# Patient Record
Sex: Female | Born: 1975 | Race: White | Hispanic: No | Marital: Married | Smoking: Former smoker
Health system: Southern US, Community
[De-identification: ages and names within clinical notes are randomized; demographics above are authoritative.]

## PROBLEM LIST (undated history)

## (undated) DIAGNOSIS — E785 Hyperlipidemia, unspecified: Secondary | ICD-10-CM

## (undated) HISTORY — DX: Hyperlipidemia, unspecified: E78.5

## (undated) HISTORY — PX: APPENDECTOMY: SHX54

## (undated) HISTORY — PX: COLONOSCOPY: SHX174

---

## 2015-12-02 ENCOUNTER — Other Ambulatory Visit: Payer: Self-pay | Admitting: Family Medicine

## 2015-12-02 DIAGNOSIS — N6310 Unspecified lump in the right breast, unspecified quadrant: Secondary | ICD-10-CM

## 2015-12-12 ENCOUNTER — Ambulatory Visit
Admission: RE | Admit: 2015-12-12 | Discharge: 2015-12-12 | Disposition: A | Discharge status: Home / Self Care | Payer: Managed Care, Other (non HMO) | Source: Ambulatory Visit | Attending: Family Medicine | Admitting: Family Medicine

## 2015-12-12 DIAGNOSIS — N6310 Unspecified lump in the right breast, unspecified quadrant: Secondary | ICD-10-CM

## 2016-11-09 ENCOUNTER — Other Ambulatory Visit: Payer: Self-pay | Admitting: Family Medicine

## 2016-11-09 DIAGNOSIS — Z1231 Encounter for screening mammogram for malignant neoplasm of breast: Secondary | ICD-10-CM

## 2016-12-13 ENCOUNTER — Ambulatory Visit
Admission: RE | Admit: 2016-12-13 | Discharge: 2016-12-13 | Disposition: A | Discharge status: Home / Self Care | Payer: Managed Care, Other (non HMO) | Source: Ambulatory Visit | Attending: Family Medicine | Admitting: Family Medicine

## 2016-12-13 DIAGNOSIS — Z1231 Encounter for screening mammogram for malignant neoplasm of breast: Secondary | ICD-10-CM

## 2017-11-10 ENCOUNTER — Other Ambulatory Visit: Payer: Self-pay | Admitting: Family Medicine

## 2017-11-10 DIAGNOSIS — Z1231 Encounter for screening mammogram for malignant neoplasm of breast: Secondary | ICD-10-CM

## 2017-12-14 ENCOUNTER — Ambulatory Visit
Admission: RE | Admit: 2017-12-14 | Discharge: 2017-12-14 | Disposition: A | Discharge status: Home / Self Care | Payer: Commercial Managed Care - PPO | Source: Ambulatory Visit | Attending: Family Medicine | Admitting: Family Medicine

## 2017-12-14 ENCOUNTER — Encounter: Payer: Self-pay | Admitting: Radiology

## 2017-12-14 DIAGNOSIS — Z1231 Encounter for screening mammogram for malignant neoplasm of breast: Secondary | ICD-10-CM

## 2018-11-27 ENCOUNTER — Other Ambulatory Visit: Payer: Self-pay | Admitting: Family Medicine

## 2018-11-27 DIAGNOSIS — Z1231 Encounter for screening mammogram for malignant neoplasm of breast: Secondary | ICD-10-CM

## 2019-01-08 ENCOUNTER — Other Ambulatory Visit: Payer: Self-pay

## 2019-01-08 ENCOUNTER — Ambulatory Visit
Admission: RE | Admit: 2019-01-08 | Discharge: 2019-01-08 | Disposition: A | Discharge status: Home / Self Care | Payer: Commercial Managed Care - PPO | Source: Ambulatory Visit | Attending: Family Medicine | Admitting: Family Medicine

## 2019-01-08 DIAGNOSIS — Z1231 Encounter for screening mammogram for malignant neoplasm of breast: Secondary | ICD-10-CM

## 2019-12-07 ENCOUNTER — Other Ambulatory Visit: Payer: Self-pay | Admitting: Family Medicine

## 2019-12-07 DIAGNOSIS — Z1231 Encounter for screening mammogram for malignant neoplasm of breast: Secondary | ICD-10-CM

## 2020-01-09 ENCOUNTER — Other Ambulatory Visit: Payer: Self-pay

## 2020-01-09 ENCOUNTER — Other Ambulatory Visit: Payer: Self-pay | Admitting: Family Medicine

## 2020-01-09 ENCOUNTER — Ambulatory Visit
Admission: RE | Admit: 2020-01-09 | Discharge: 2020-01-09 | Disposition: A | Discharge status: Home / Self Care | Payer: Commercial Managed Care - PPO | Source: Ambulatory Visit | Attending: Family Medicine | Admitting: Family Medicine

## 2020-01-09 DIAGNOSIS — Z1231 Encounter for screening mammogram for malignant neoplasm of breast: Secondary | ICD-10-CM

## 2020-12-12 ENCOUNTER — Other Ambulatory Visit: Payer: Self-pay | Admitting: Family Medicine

## 2020-12-12 DIAGNOSIS — Z1231 Encounter for screening mammogram for malignant neoplasm of breast: Secondary | ICD-10-CM

## 2021-01-09 ENCOUNTER — Other Ambulatory Visit: Payer: Self-pay

## 2021-01-09 ENCOUNTER — Ambulatory Visit
Admission: RE | Admit: 2021-01-09 | Discharge: 2021-01-09 | Disposition: A | Discharge status: Home / Self Care | Payer: Commercial Managed Care - PPO | Source: Ambulatory Visit

## 2021-01-09 DIAGNOSIS — Z1231 Encounter for screening mammogram for malignant neoplasm of breast: Secondary | ICD-10-CM

## 2021-12-09 ENCOUNTER — Other Ambulatory Visit: Payer: Self-pay | Admitting: Family Medicine

## 2021-12-09 DIAGNOSIS — Z1231 Encounter for screening mammogram for malignant neoplasm of breast: Secondary | ICD-10-CM

## 2022-01-18 ENCOUNTER — Ambulatory Visit: Payer: Commercial Managed Care - PPO

## 2022-02-15 ENCOUNTER — Ambulatory Visit
Admission: RE | Admit: 2022-02-15 | Discharge: 2022-02-15 | Disposition: A | Discharge status: Home / Self Care | Payer: Commercial Managed Care - PPO | Source: Ambulatory Visit | Attending: Family Medicine | Admitting: Family Medicine

## 2022-02-15 DIAGNOSIS — Z1231 Encounter for screening mammogram for malignant neoplasm of breast: Secondary | ICD-10-CM

## 2022-02-25 ENCOUNTER — Other Ambulatory Visit (HOSPITAL_BASED_OUTPATIENT_CLINIC_OR_DEPARTMENT_OTHER): Payer: Self-pay

## 2022-02-25 DIAGNOSIS — R0602 Shortness of breath: Secondary | ICD-10-CM

## 2022-02-26 ENCOUNTER — Ambulatory Visit (INDEPENDENT_AMBULATORY_CARE_PROVIDER_SITE_OTHER): Payer: Commercial Managed Care - PPO | Admitting: Pulmonary Disease

## 2022-02-26 ENCOUNTER — Encounter (HOSPITAL_BASED_OUTPATIENT_CLINIC_OR_DEPARTMENT_OTHER): Payer: Self-pay | Admitting: Pulmonary Disease

## 2022-02-26 VITALS — BP 110/80 | HR 88 | Ht 61.0 in | Wt 162.0 lb

## 2022-02-26 DIAGNOSIS — R0602 Shortness of breath: Secondary | ICD-10-CM

## 2022-02-26 DIAGNOSIS — J4540 Moderate persistent asthma, uncomplicated: Secondary | ICD-10-CM | POA: Diagnosis not present

## 2022-02-26 LAB — PULMONARY FUNCTION TEST
DL/VA % pred: 137 %
DL/VA: 6.09 ml/min/mmHg/L
DLCO cor % pred: 122 %
DLCO cor: 23.55 ml/min/mmHg
DLCO unc % pred: 122 %
DLCO unc: 23.55 ml/min/mmHg
FEF 25-75 Post: 2.81 L/sec
FEF 25-75 Pre: 3.01 L/sec
FEF2575-%Change-Post: -6 %
FEF2575-%Pred-Post: 103 %
FEF2575-%Pred-Pre: 111 %
FEV1-%Change-Post: 0 %
FEV1-%Pred-Post: 96 %
FEV1-%Pred-Pre: 95 %
FEV1-Post: 2.49 L
FEV1-Pre: 2.49 L
FEV1FVC-%Change-Post: 0 %
FEV1FVC-%Pred-Pre: 105 %
FEV6-%Change-Post: 0 %
FEV6-%Pred-Post: 91 %
FEV6-%Pred-Pre: 92 %
FEV6-Post: 2.9 L
FEV6-Pre: 2.92 L
FEV6FVC-%Pred-Post: 102 %
FEV6FVC-%Pred-Pre: 102 %
FVC-%Change-Post: 0 %
FVC-%Pred-Post: 89 %
FVC-%Pred-Pre: 90 %
FVC-Post: 2.9 L
FVC-Pre: 2.92 L
Post FEV1/FVC ratio: 86 %
Post FEV6/FVC ratio: 100 %
Pre FEV1/FVC ratio: 85 %
Pre FEV6/FVC Ratio: 100 %
RV % pred: 99 %
RV: 1.57 L
TLC % pred: 93 %
TLC: 4.3 L

## 2022-02-26 MED ORDER — FLUTICASONE-SALMETEROL 250-50 MCG/ACT IN AEPB: 1.0000 | INHALATION_SPRAY | Freq: Two times a day (BID) | RESPIRATORY_TRACT | 5 refills | Status: DC

## 2022-02-26 MED ORDER — ALBUTEROL SULFATE 0.63 MG/3ML IN NEBU: 1.0000 | INHALATION_SOLUTION | Freq: Four times a day (QID) | RESPIRATORY_TRACT | 0 refills | Status: AC | PRN

## 2022-02-26 NOTE — Progress Notes (Signed)
Full PFT Performed Today  

## 2022-02-26 NOTE — Patient Instructions (Signed)
Full PFT Performed Today  

## 2022-02-26 NOTE — Progress Notes (Signed)
Subjective:   PATIENT ID: Emily Sandoval GENDER: female DOB: 05-Nov-1975, MRN: [id number]  Chief Complaint  Patient presents with   Consult    Breathing trouble, possible asthma     Reason for Visit: New consult for possible asthma  Ms. Masa Lubin is a 46 year old female former smoker with asthma and HLD who presents as new consult for cough and shortness of breath x 4 months.  During the summer she reports a cold that left her with a persistent hacky cough. Also associated with her loss of taste and smell but did not test for COVID. She used her daughter's albuterol inhaler that helped stop the cough. She was evaluated by her PCP, Dr. Chanetta Marshall and treated with prednisone x 5 days in August 2023 with complete resolution of cough. However it returned after stopping. She has been using her albuterol inhaler once a day. She was started on Symbicort and has had partial improvement. She still has shortness of breath and a "weight on her chest."  Recently went to the [place] over Thanksgiving, three weeks ago, and has noticed cough with increased humidity.  Reports history of allergy related asthma attacks and recurrent bronchitis in her 20s but not on maintenance meds. No issues with chronic respiratory issues since then.  Social History: Works in Data processing manager  I have personally reviewed patient's past medical/family/social history, allergies, current medications.  Past Medical History:  Diagnosis Date   Hyperlipidemia      Family History  Problem Relation Age of Onset   Osteoporosis Mother    High Cholesterol Mother    High blood pressure Father    Stroke Father    High Cholesterol Father    Healthy Sister    Bipolar disorder Brother    Breast cancer Maternal Grandmother 41   Dementia Paternal Grandmother    Stroke Paternal Grandfather      Social History   Occupational History   Not on file  Tobacco Use   Smoking status: Former    Packs/day: 0.25     Years: 10.00    Total pack years: 2.50    Types: Cigarettes    Quit date: 2003    Years since quitting: 20.9   Smokeless tobacco: Never  Vaping Use   Vaping Use: Never used  Substance and Sexual Activity   Alcohol use: Not on file   Drug use: Not on file   Sexual activity: Not on file    Allergies  Allergen Reactions   Morphine Nausea Only    Other Reaction(s): vomiting   Hydrocodone Nausea And Vomiting   Sulfamethoxazole-Trimethoprim Other (See Comments) and Nausea And Vomiting   Tetanus Antitoxin Other (See Comments)   Tetanus Immune Globulin Nausea And Vomiting     Outpatient Medications Prior to Visit  Medication Sig Dispense Refill   albuterol (VENTOLIN HFA) 108 (90 Base) MCG/ACT inhaler SMARTSIG:1 Puff(s) Via Inhaler Every 4 Hours PRN     b complex vitamins capsule Take 1 capsule by mouth daily.     cetirizine (ZYRTEC) 10 MG tablet Take 1 tablet by mouth daily.     levonorgestrel (MIRENA, 52 MG,) 20 MCG/DAY IUD insert one intrauterine device via intrauterine route, provided by [facility]     rosuvastatin (CRESTOR) 10 MG tablet Take 10 mg by mouth at bedtime.     topiramate (TOPAMAX) 25 MG tablet Take 50 mg by mouth daily.     SYMBICORT 80-4.5 MCG/ACT inhaler Inhale into the lungs.  No facility-administered medications prior to visit.    Review of Systems  Constitutional:  Negative for chills, diaphoresis, fever, malaise/fatigue and weight loss.  HENT:  Positive for congestion.   Respiratory:  Positive for cough and shortness of breath. Negative for hemoptysis, sputum production and wheezing.   Cardiovascular:  Negative for chest pain, palpitations and leg swelling.  Gastrointestinal:  Positive for heartburn.     Objective:   Vitals:   02/26/22 1539  BP: 110/80  Pulse: 88  SpO2: 100%  Weight: 162 lb (73.5 kg)  Height: 5\' 1"  (1.549 m)   SpO2: 100 % O2 Device: None (Room air)  Physical Exam: General: Well-appearing, no acute distress HENT: [place],  AT Eyes: EOMI, no scleral icterus Respiratory: Clear to auscultation bilaterally.  No crackles, wheezing or rales Cardiovascular: RRR, -M/R/G, no JVD Extremities:-Edema,-tenderness Neuro: AAO x4, CNII-XII grossly intact Psych: Normal mood, normal affect  Data Reviewed:  Imaging: None on file  PFT: 02/26/22 FVC 2.90 (89%) FEV1 2.49 (96%) Ratio 85  TLC 93% DLCO 122%. No significant BD response Interpretation: Normal PFTs  Sandoval: CBC No results found for: "WBC", "RBC", "HGB", "HCT", "PLT", "MCV", "MCH", "MCHC", "RDW", "LYMPHSABS", "MONOABS", "EOSABS", "BASOSABS"      Assessment & Plan:   Discussion: 46 year old female former smoker with asthma and HLD who presents as new consult for cough and shortness of breath x 4 months.  Reviewed PFTs which is overall normal. Increased DLCO is suggestive of asthma  Discussed clinical course and management of asthma including bronchodilator regimen and action plan for exacerbation. Decline methacholine challenge. Will increase ICS for symptom relief however if ineffective reduce to Symbicort medium or low-dose  We reviewed clinical course of COVID-19 including long-term complications including post-inflammatory lung disease and long hauler symptoms.   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 Rinse out mouth after use --CONTINUE Albuterol AS NEEDED for shortness of breath or wheezing  Health Maintenance Immunization History  Administered Date(s) Administered   Influenza-Unspecified 01/27/2022    No orders of the defined types were placed in this encounter.  Meds ordered this encounter  Medications   fluticasone-salmeterol (ADVAIR DISKUS) 250-50 MCG/ACT AEPB    Sig: Inhale 1 puff into the lungs in the morning and at bedtime.    Dispense:  60 each    Refill:  5   albuterol (ACCUNEB) 0.63 MG/3ML nebulizer solution    Sig: Take 3 mLs (0.63 mg  total) by nebulization every 6 (six) hours as needed for wheezing.    Dispense:  75 mL    Refill:  0    Return in about 2 months (around 04/29/2022).  I have spent a total time of 45-minutes on the day of the appointment reviewing prior documentation, coordinating care and discussing medical diagnosis and plan with the patient/family. Imaging, Sandoval and tests included in this note have been reviewed and interpreted independently by me.  Tasmia Blumer 05/01/2022, MD [facility] 02/26/2022 4:30 PM  Office Number [phone]

## 2022-02-26 NOTE — Patient Instructions (Addendum)
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 Rinse out mouth after use --CONTINUE Albuterol AS NEEDED for shortness of breath or wheezing --Albuterol neb meds ordered  Follow-up with me in 2 months

## 2022-05-03 ENCOUNTER — Ambulatory Visit (HOSPITAL_BASED_OUTPATIENT_CLINIC_OR_DEPARTMENT_OTHER): Payer: Commercial Managed Care - PPO | Admitting: Pulmonary Disease

## 2022-07-21 ENCOUNTER — Other Ambulatory Visit: Payer: Self-pay

## 2022-07-23 ENCOUNTER — Other Ambulatory Visit (HOSPITAL_BASED_OUTPATIENT_CLINIC_OR_DEPARTMENT_OTHER): Payer: Self-pay

## 2022-07-23 MED ORDER — TIRZEPATIDE-WEIGHT MANAGEMENT 5 MG/0.5ML ~~LOC~~ SOAJ
5.0000 mg | SUBCUTANEOUS | 0 refills | Status: DC
  Filled 2022-07-23 – 2022-07-30 (×2): qty 2, 28d supply, fill #0

## 2022-07-26 ENCOUNTER — Other Ambulatory Visit (HOSPITAL_BASED_OUTPATIENT_CLINIC_OR_DEPARTMENT_OTHER): Payer: Self-pay

## 2022-07-26 MED ORDER — ONDANSETRON HCL 4 MG PO TABS
4.0000 mg | ORAL_TABLET | Freq: Three times a day (TID) | ORAL | 0 refills | Status: DC | PRN
  Filled 2022-07-26: qty 30, 10d supply, fill #0

## 2022-07-26 MED ORDER — WEGOVY 0.25 MG/0.5ML ~~LOC~~ SOAJ
0.2500 mg | SUBCUTANEOUS | 0 refills | Status: DC
  Filled 2022-07-26: qty 2, 28d supply, fill #0

## 2022-07-30 ENCOUNTER — Other Ambulatory Visit (HOSPITAL_BASED_OUTPATIENT_CLINIC_OR_DEPARTMENT_OTHER): Payer: Self-pay

## 2022-07-31 ENCOUNTER — Other Ambulatory Visit (HOSPITAL_BASED_OUTPATIENT_CLINIC_OR_DEPARTMENT_OTHER): Payer: Self-pay

## 2022-08-17 ENCOUNTER — Encounter (HOSPITAL_BASED_OUTPATIENT_CLINIC_OR_DEPARTMENT_OTHER): Payer: Self-pay | Admitting: Pharmacist

## 2022-08-17 ENCOUNTER — Other Ambulatory Visit (HOSPITAL_BASED_OUTPATIENT_CLINIC_OR_DEPARTMENT_OTHER): Payer: Self-pay

## 2022-08-17 MED ORDER — ZEPBOUND 5 MG/0.5ML ~~LOC~~ SOAJ
5.0000 mg | SUBCUTANEOUS | 0 refills | Status: DC
  Filled 2022-08-17 – 2022-08-20 (×2): qty 2, 28d supply, fill #0

## 2022-08-20 ENCOUNTER — Other Ambulatory Visit (HOSPITAL_BASED_OUTPATIENT_CLINIC_OR_DEPARTMENT_OTHER): Payer: Self-pay

## 2022-09-14 ENCOUNTER — Other Ambulatory Visit (HOSPITAL_BASED_OUTPATIENT_CLINIC_OR_DEPARTMENT_OTHER): Payer: Self-pay

## 2022-09-14 MED ORDER — ZEPBOUND 5 MG/0.5ML ~~LOC~~ SOAJ
SUBCUTANEOUS | 0 refills | Status: DC
  Filled 2022-09-14: qty 2, 30d supply, fill #0

## 2022-09-22 ENCOUNTER — Other Ambulatory Visit (HOSPITAL_COMMUNITY): Payer: Self-pay | Admitting: Orthopedic Surgery

## 2022-10-11 ENCOUNTER — Other Ambulatory Visit (HOSPITAL_BASED_OUTPATIENT_CLINIC_OR_DEPARTMENT_OTHER): Payer: Self-pay

## 2022-10-11 MED ORDER — ZEPBOUND 5 MG/0.5ML ~~LOC~~ SOAJ
5.0000 mg | SUBCUTANEOUS | 0 refills | Status: DC
  Filled 2022-10-11: qty 2, 28d supply, fill #0

## 2022-11-09 ENCOUNTER — Other Ambulatory Visit (HOSPITAL_BASED_OUTPATIENT_CLINIC_OR_DEPARTMENT_OTHER): Payer: Self-pay

## 2022-11-09 MED ORDER — ZEPBOUND 5 MG/0.5ML ~~LOC~~ SOAJ
5.0000 mg | SUBCUTANEOUS | 0 refills | Status: AC
  Filled 2022-11-09: qty 2, 28d supply, fill #0

## 2022-12-06 ENCOUNTER — Other Ambulatory Visit (HOSPITAL_BASED_OUTPATIENT_CLINIC_OR_DEPARTMENT_OTHER): Payer: Self-pay

## 2022-12-06 MED ORDER — ZEPBOUND 7.5 MG/0.5ML ~~LOC~~ SOAJ
7.5000 mg | SUBCUTANEOUS | 0 refills | Status: AC
  Filled 2022-12-06: qty 2, 28d supply, fill #0

## 2022-12-16 IMAGING — MG MM DIGITAL SCREENING BILAT W/ TOMO AND CAD
8 series · 8 of 24 positions shown · non-contrast
Comparison: Previous exam(s).

CLINICAL DATA: Screening.

EXAM:
DIGITAL SCREENING BILATERAL MAMMOGRAM WITH TOMOSYNTHESIS AND CAD
TECHNIQUE: Bilateral screening digital craniocaudal and mediolateral oblique
mammograms were obtained. Bilateral screening digital breast
tomosynthesis was performed. The images were evaluated with
computer-aided detection.

[L CC synth-2D]
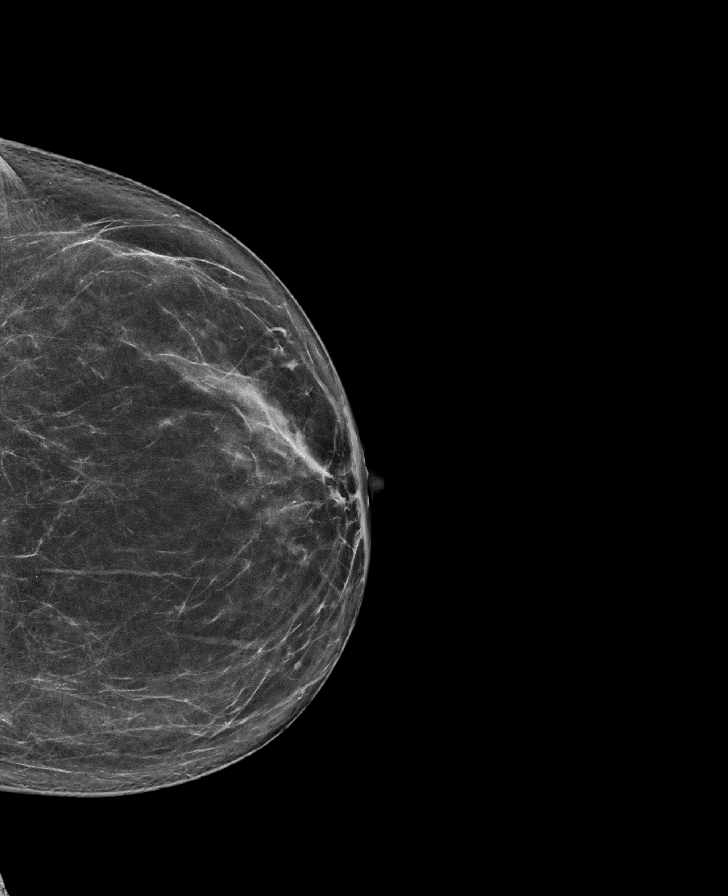

[R CC synth-2D]
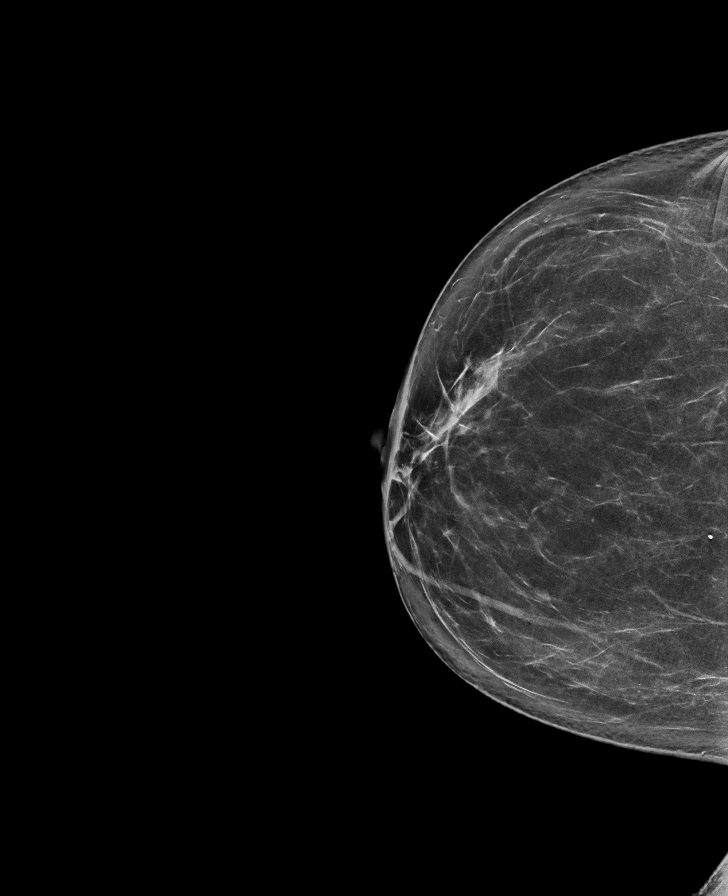

[R MLO synth-2D]
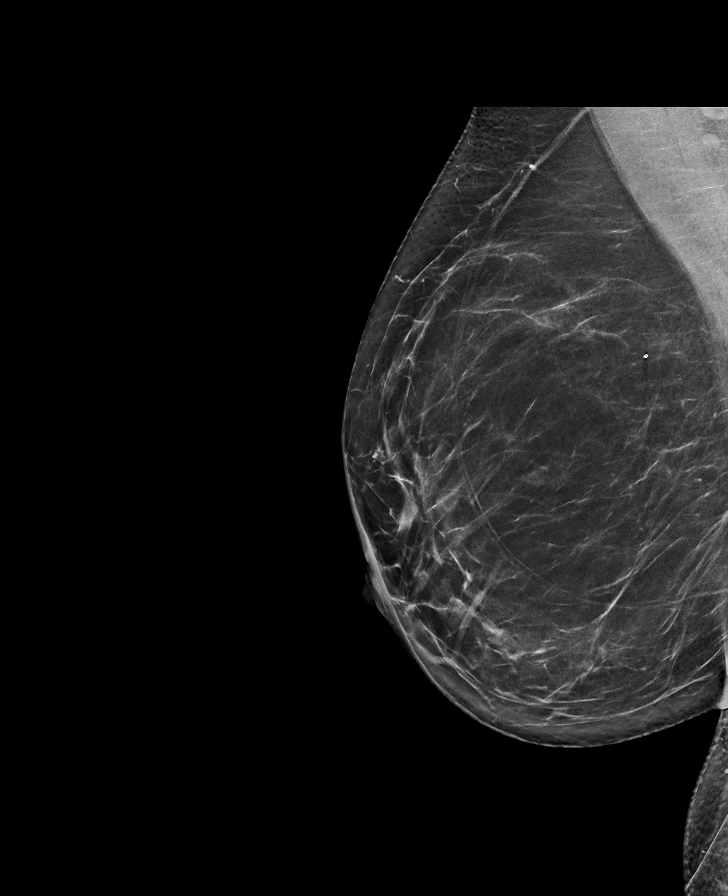

[L MLO synth-2D]
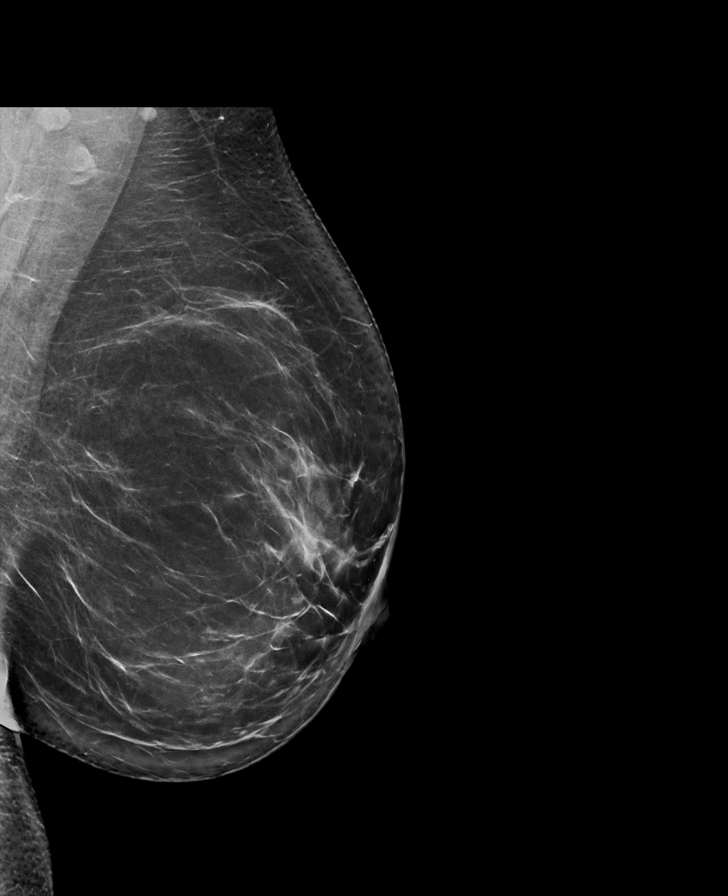

[R MLO tomo · tomo slice 40/79.0]
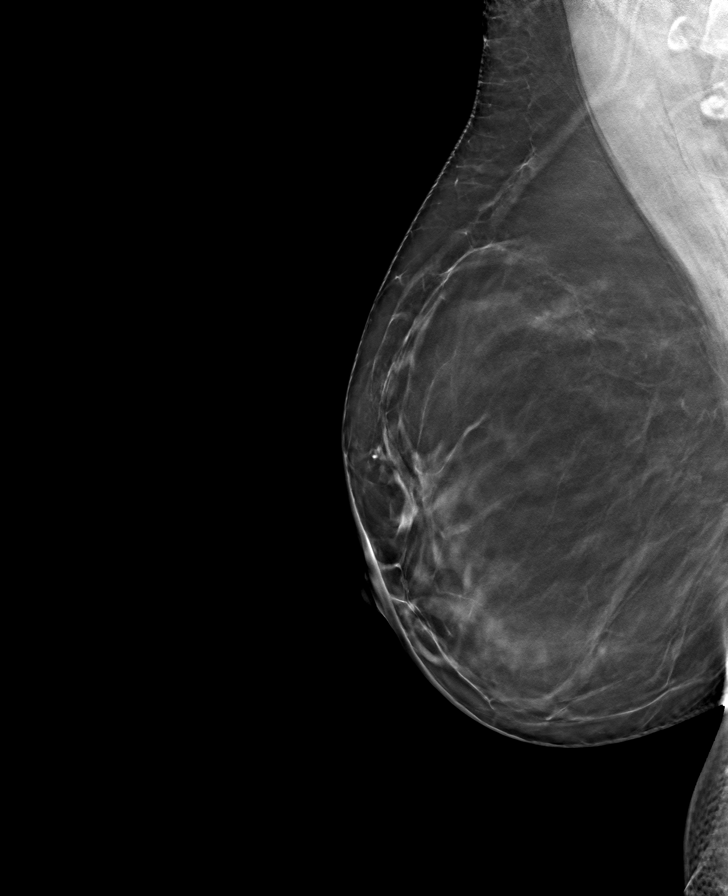

[R CC tomo · tomo slice 41/80.0]
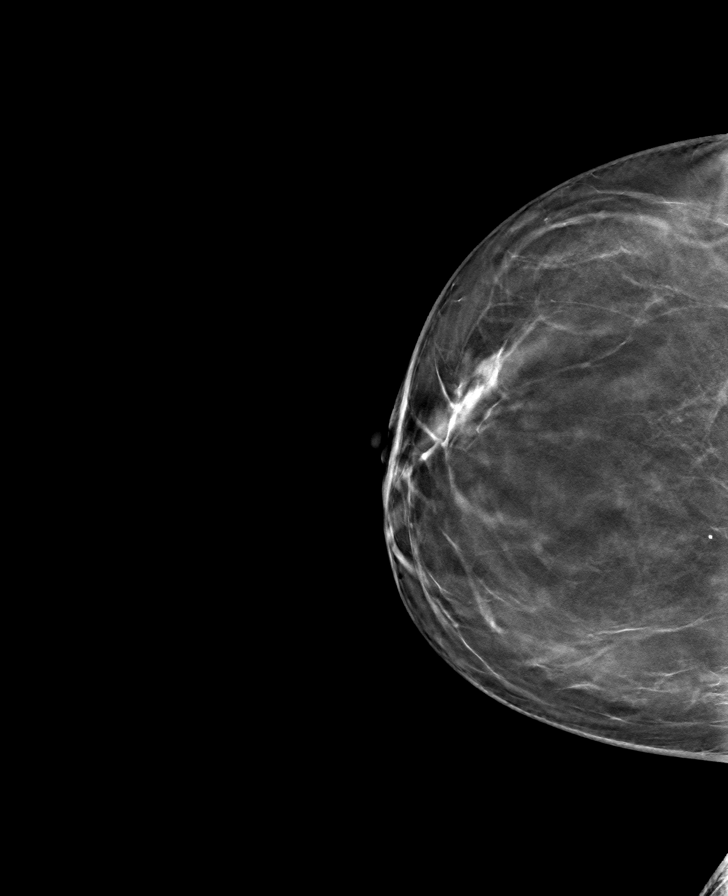

[L MLO tomo · tomo slice 45/88.0]
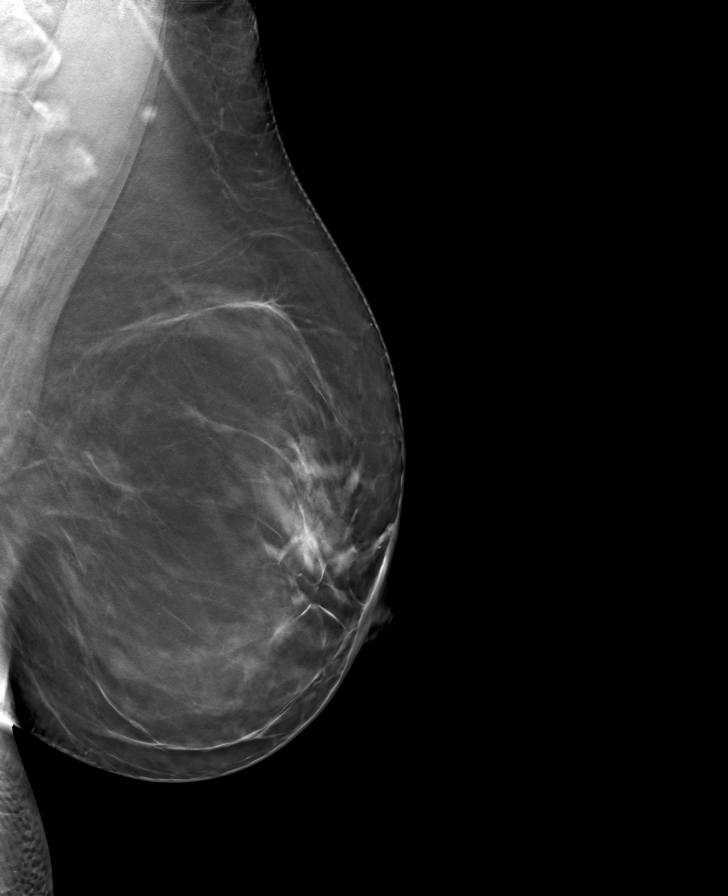

[L CC tomo · tomo slice 39/78.0]
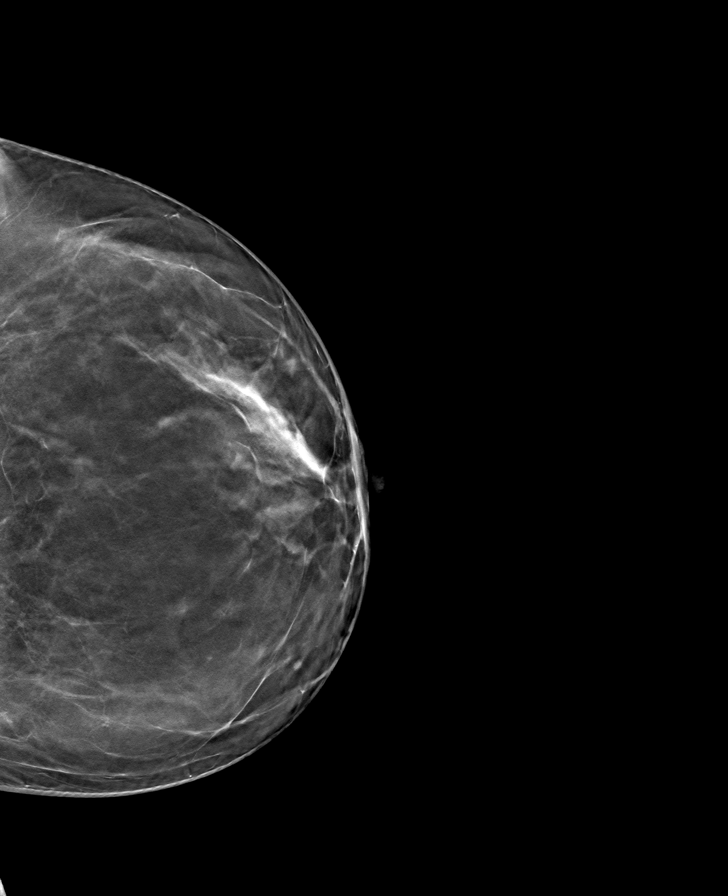

[8 of 24 positions shown; findings below may reference images not displayed]

ACR Breast Density Category b: There are scattered areas of
fibroglandular density.
FINDINGS: There are no findings suspicious for malignancy.
IMPRESSION: No mammographic evidence of malignancy. A result letter of this
screening mammogram will be mailed directly to the patient.

RECOMMENDATION:
Screening mammogram in one year. (Code:51-O-LD2)

BI-RADS CATEGORY  1: Negative.

## 2023-01-03 ENCOUNTER — Other Ambulatory Visit: Payer: Self-pay | Admitting: Family Medicine

## 2023-01-03 DIAGNOSIS — Z1231 Encounter for screening mammogram for malignant neoplasm of breast: Secondary | ICD-10-CM

## 2023-01-10 ENCOUNTER — Other Ambulatory Visit: Payer: Self-pay

## 2023-01-10 ENCOUNTER — Other Ambulatory Visit (HOSPITAL_BASED_OUTPATIENT_CLINIC_OR_DEPARTMENT_OTHER): Payer: Self-pay

## 2023-01-10 MED ORDER — ZEPBOUND 7.5 MG/0.5ML ~~LOC~~ SOAJ
7.5000 mg | SUBCUTANEOUS | 0 refills | Status: DC
  Filled 2023-01-10: qty 2, 28d supply, fill #0

## 2023-01-13 ENCOUNTER — Other Ambulatory Visit: Payer: Self-pay

## 2023-01-13 ENCOUNTER — Encounter (HOSPITAL_BASED_OUTPATIENT_CLINIC_OR_DEPARTMENT_OTHER): Payer: Self-pay | Admitting: Orthopedic Surgery

## 2023-01-14 NOTE — Progress Notes (Signed)
Ensure presurgery drink given to pt with written instruction to complete by 0515 on DOS. Pt verbalized understanding        Enhanced Recovery after Surgery for Orthopedics Enhanced Recovery after Surgery is a protocol used to improve the stress on your body and your recovery after surgery.  Patient Instructions  The night before surgery:  No food after midnight. ONLY clear liquids after midnight  The day of surgery (if you do NOT have diabetes):  Drink ONE (1) Pre-Surgery Clear Ensure as directed.   This drink was given to you during your hospital  pre-op appointment visit. The pre-op nurse will instruct you on the time to drink the  Pre-Surgery Ensure depending on your surgery time. Finish the drink at the designated time by the pre-op nurse.  Nothing else to drink after completing the  Pre-Surgery Clear Ensure.  The day of surgery (if you have diabetes): Drink ONE (1) Gatorade 2 (G2) as directed. This drink was given to you during your hospital  pre-op appointment visit.  The pre-op nurse will instruct you on the time to drink the   Gatorade 2 (G2) depending on your surgery time. Color of the Gatorade may vary. Red is not allowed. Nothing else to drink after completing the  Gatorade 2 (G2).         If you have questions, please contact your surgeon's office.

## 2023-01-19 NOTE — Anesthesia Preprocedure Evaluation (Signed)
Anesthesia Evaluation  Patient identified by MRN, date of birth, ID band Patient awake    Reviewed: Allergy & Precautions, H&P , NPO status , Patient's Chart, lab work & pertinent test results  Airway Mallampati: II  TM Distance: >3 FB Neck ROM: Full    Dental no notable dental hx.    Pulmonary neg pulmonary ROS, former smoker   Pulmonary exam normal breath sounds clear to auscultation       Cardiovascular Exercise Tolerance: Good negative cardio ROS Normal cardiovascular exam Rhythm:Regular Rate:Normal     Neuro/Psych negative neurological ROS  negative psych ROS   GI/Hepatic negative GI ROS, Neg liver ROS,,,  Endo/Other  negative endocrine ROS    Renal/GU negative Renal ROS  negative genitourinary   Musculoskeletal negative musculoskeletal ROS (+)    Abdominal   Peds negative pediatric ROS (+)  Hematology negative hematology ROS (+)   Anesthesia Other Findings   Reproductive/Obstetrics negative OB ROS                             Anesthesia Physical Anesthesia Plan  ASA: 2  Anesthesia Plan: General and Regional   Post-op Pain Management: Regional block*, Tylenol PO (pre-op)* and Celebrex PO (pre-op)*   Induction: Intravenous  PONV Risk Score and Plan: 3 and Ondansetron, Dexamethasone and Midazolam  Airway Management Planned: Oral ETT and LMA  Additional Equipment: None  Intra-op Plan:   Post-operative Plan: Extubation in OR  Informed Consent: I have reviewed the patients History and Physical, chart, labs and discussed the procedure including the risks, benefits and alternatives for the proposed anesthesia with the patient or authorized representative who has indicated his/her understanding and acceptance.     Dental advisory given  Plan Discussed with: Anesthesiologist and CRNA  Anesthesia Plan Comments: (Discussed both nerve block for pain relief post-op and GA;  including NV, sore throat, dental injury, and pulmonary complications)        Anesthesia Quick Evaluation

## 2023-01-20 ENCOUNTER — Other Ambulatory Visit: Payer: Self-pay

## 2023-01-20 ENCOUNTER — Ambulatory Visit (HOSPITAL_BASED_OUTPATIENT_CLINIC_OR_DEPARTMENT_OTHER)
Admission: RE | Admit: 2023-01-20 | Discharge: 2023-01-20 | Disposition: A | Discharge status: Home / Self Care | Payer: Commercial Managed Care - PPO | Attending: Orthopedic Surgery | Admitting: Orthopedic Surgery

## 2023-01-20 ENCOUNTER — Ambulatory Visit (HOSPITAL_BASED_OUTPATIENT_CLINIC_OR_DEPARTMENT_OTHER): Payer: Commercial Managed Care - PPO | Admitting: Anesthesiology

## 2023-01-20 ENCOUNTER — Ambulatory Visit (HOSPITAL_BASED_OUTPATIENT_CLINIC_OR_DEPARTMENT_OTHER): Payer: Commercial Managed Care - PPO

## 2023-01-20 ENCOUNTER — Encounter (HOSPITAL_BASED_OUTPATIENT_CLINIC_OR_DEPARTMENT_OTHER): Payer: Self-pay | Admitting: Orthopedic Surgery

## 2023-01-20 ENCOUNTER — Other Ambulatory Visit (HOSPITAL_BASED_OUTPATIENT_CLINIC_OR_DEPARTMENT_OTHER): Payer: Self-pay

## 2023-01-20 ENCOUNTER — Encounter (HOSPITAL_BASED_OUTPATIENT_CLINIC_OR_DEPARTMENT_OTHER)
Admission: RE | Disposition: A | Discharge status: Home / Self Care | Payer: Self-pay | Source: Home / Self Care | Attending: Orthopedic Surgery

## 2023-01-20 DIAGNOSIS — Z87891 Personal history of nicotine dependence: Secondary | ICD-10-CM | POA: Insufficient documentation

## 2023-01-20 DIAGNOSIS — Z98891 History of uterine scar from previous surgery: Secondary | ICD-10-CM | POA: Insufficient documentation

## 2023-01-20 DIAGNOSIS — E785 Hyperlipidemia, unspecified: Secondary | ICD-10-CM | POA: Insufficient documentation

## 2023-01-20 DIAGNOSIS — Z793 Long term (current) use of hormonal contraceptives: Secondary | ICD-10-CM | POA: Insufficient documentation

## 2023-01-20 DIAGNOSIS — M21612 Bunion of left foot: Secondary | ICD-10-CM | POA: Diagnosis present

## 2023-01-20 DIAGNOSIS — Z01818 Encounter for other preprocedural examination: Secondary | ICD-10-CM

## 2023-01-20 HISTORY — PX: BUNIONECTOMY: SHX129

## 2023-01-20 LAB — POCT PREGNANCY, URINE: Preg Test, Ur: NEGATIVE

## 2023-01-20 SURGERY — BUNIONECTOMY
Anesthesia: Regional | Site: Toe | Laterality: Left

## 2023-01-20 MED ORDER — OXYCODONE HCL 5 MG PO TABS: 5.0000 mg | ORAL_TABLET | Freq: Once | ORAL | Status: DC | PRN

## 2023-01-20 MED ORDER — FENTANYL CITRATE (PF) 100 MCG/2ML IJ SOLN
INTRAMUSCULAR | Status: AC
  Filled 2023-01-20: qty 2

## 2023-01-20 MED ORDER — ONDANSETRON HCL 4 MG/2ML IJ SOLN: 4.0000 mg | Freq: Once | INTRAMUSCULAR | Status: DC | PRN

## 2023-01-20 MED ORDER — FENTANYL CITRATE (PF) 100 MCG/2ML IJ SOLN
25.0000 ug | INTRAMUSCULAR | Status: DC | PRN
Start: 2023-01-20 — End: 2023-01-20

## 2023-01-20 MED ORDER — MIDAZOLAM HCL 2 MG/2ML IJ SOLN
2.0000 mg | Freq: Once | INTRAMUSCULAR | Status: AC
  Administered 2023-01-20: 2 mg via INTRAVENOUS

## 2023-01-20 MED ORDER — EPHEDRINE SULFATE (PRESSORS) 50 MG/ML IJ SOLN
INTRAMUSCULAR | Status: DC | PRN
  Administered 2023-01-20: 10 mg via INTRAVENOUS

## 2023-01-20 MED ORDER — MEPERIDINE HCL 25 MG/ML IJ SOLN: 6.2500 mg | INTRAMUSCULAR | Status: DC | PRN

## 2023-01-20 MED ORDER — SENNA 8.6 MG PO TABS
2.0000 | ORAL_TABLET | Freq: Two times a day (BID) | ORAL | 0 refills | Status: AC
  Filled 2023-01-20: qty 30, 7d supply, fill #0
  Filled 2023-07-11: qty 30, 8d supply, fill #0

## 2023-01-20 MED ORDER — FENTANYL CITRATE (PF) 100 MCG/2ML IJ SOLN
100.0000 ug | Freq: Once | INTRAMUSCULAR | Status: AC
  Administered 2023-01-20: 100 ug via INTRAVENOUS

## 2023-01-20 MED ORDER — LACTATED RINGERS IV SOLN: INTRAVENOUS | Status: DC

## 2023-01-20 MED ORDER — PROPOFOL 10 MG/ML IV BOLUS
INTRAVENOUS | Status: DC | PRN
  Administered 2023-01-20 (×2): 100 mg via INTRAVENOUS

## 2023-01-20 MED ORDER — OXYCODONE HCL 5 MG PO TABS
5.0000 mg | ORAL_TABLET | Freq: Four times a day (QID) | ORAL | 0 refills | Status: AC | PRN
  Filled 2023-01-20: qty 12, 3d supply, fill #0

## 2023-01-20 MED ORDER — DEXAMETHASONE SODIUM PHOSPHATE 10 MG/ML IJ SOLN
INTRAMUSCULAR | Status: AC
  Filled 2023-01-20: qty 1

## 2023-01-20 MED ORDER — LIDOCAINE 2% (20 MG/ML) 5 ML SYRINGE
INTRAMUSCULAR | Status: DC | PRN
  Administered 2023-01-20: 100 mg via INTRAVENOUS

## 2023-01-20 MED ORDER — SODIUM CHLORIDE 0.9 % IV SOLN
INTRAVENOUS | Status: AC | PRN
  Administered 2023-01-20: 500 mL

## 2023-01-20 MED ORDER — DEXAMETHASONE SODIUM PHOSPHATE 10 MG/ML IJ SOLN
INTRAMUSCULAR | Status: DC | PRN
  Administered 2023-01-20: 4 mg via INTRAVENOUS

## 2023-01-20 MED ORDER — LIDOCAINE HCL (CARDIAC) PF 100 MG/5ML IV SOSY
PREFILLED_SYRINGE | INTRAVENOUS | Status: DC | PRN
  Administered 2023-01-20: 40 mg via INTRAVENOUS

## 2023-01-20 MED ORDER — ROPIVACAINE HCL 5 MG/ML IJ SOLN
INTRAMUSCULAR | Status: DC | PRN
Start: 2023-01-20 — End: 2023-01-20
  Administered 2023-01-20 (×2): 20 mL via PERINEURAL

## 2023-01-20 MED ORDER — ACETAMINOPHEN 325 MG PO TABS: 325.0000 mg | ORAL_TABLET | ORAL | Status: DC | PRN

## 2023-01-20 MED ORDER — DOCUSATE SODIUM 100 MG PO CAPS
100.0000 mg | ORAL_CAPSULE | Freq: Two times a day (BID) | ORAL | 0 refills | Status: AC
  Filled 2023-01-20: qty 30, 15d supply, fill #0

## 2023-01-20 MED ORDER — ONDANSETRON HCL 4 MG/2ML IJ SOLN
INTRAMUSCULAR | Status: DC | PRN
  Administered 2023-01-20: 4 mg via INTRAVENOUS

## 2023-01-20 MED ORDER — ONDANSETRON HCL 4 MG/2ML IJ SOLN
INTRAMUSCULAR | Status: AC
  Filled 2023-01-20: qty 2

## 2023-01-20 MED ORDER — MIDAZOLAM HCL 2 MG/2ML IJ SOLN
INTRAMUSCULAR | Status: AC
  Filled 2023-01-20: qty 2

## 2023-01-20 MED ORDER — FENTANYL CITRATE (PF) 100 MCG/2ML IJ SOLN
INTRAMUSCULAR | Status: DC | PRN
  Administered 2023-01-20: 50 ug via INTRAVENOUS

## 2023-01-20 MED ORDER — SODIUM CHLORIDE 0.9 % IV SOLN: INTRAVENOUS | Status: DC

## 2023-01-20 MED ORDER — ACETAMINOPHEN 160 MG/5ML PO SOLN: 325.0000 mg | ORAL | Status: DC | PRN

## 2023-01-20 MED ORDER — ACETAMINOPHEN 500 MG PO TABS
1000.0000 mg | ORAL_TABLET | Freq: Once | ORAL | Status: AC
Start: 2023-01-20 — End: 2023-01-20
  Administered 2023-01-20: 1000 mg via ORAL

## 2023-01-20 MED ORDER — OXYCODONE HCL 5 MG/5ML PO SOLN: 5.0000 mg | Freq: Once | ORAL | Status: DC | PRN

## 2023-01-20 MED ORDER — CEFAZOLIN SODIUM-DEXTROSE 2-4 GM/100ML-% IV SOLN
INTRAVENOUS | Status: AC
  Filled 2023-01-20: qty 100

## 2023-01-20 MED ORDER — CEFAZOLIN SODIUM-DEXTROSE 2-4 GM/100ML-% IV SOLN
2.0000 g | INTRAVENOUS | Status: AC
  Administered 2023-01-20: 2 g via INTRAVENOUS

## 2023-01-20 MED ORDER — LIDOCAINE 2% (20 MG/ML) 5 ML SYRINGE
INTRAMUSCULAR | Status: AC
  Filled 2023-01-20: qty 5

## 2023-01-20 MED ORDER — ACETAMINOPHEN 500 MG PO TABS
ORAL_TABLET | ORAL | Status: AC
  Filled 2023-01-20: qty 2

## 2023-01-20 SURGICAL SUPPLY — 95 items
APL PRP STRL LF DISP 70% ISPRP (MISCELLANEOUS) ×1
BANDAGE ESMARK 6X9 LF (GAUZE/BANDAGES/DRESSINGS) IMPLANT
BIT DRILL 3 CANN STRGHT (BIT) ×1
BIT DRILL CANN 2.9 (BIT) IMPLANT
BLADE AVERAGE 25X9 (BLADE) IMPLANT
BLADE LONG MED 25X9 (BLADE) IMPLANT
BLADE MICRO SAGITTAL (BLADE) IMPLANT
BLADE MINI RND TIP GREEN BEAV (BLADE) ×1 IMPLANT
BLADE OSC/SAG .038X5.5 CUT EDG (BLADE) IMPLANT
BLADE SURG 15 STRL LF DISP TIS (BLADE) ×2 IMPLANT
BLADE SURG 15 STRL SS (BLADE) ×1
BNDG ADH 5X4 AIR PERM ELC (GAUZE/BANDAGES/DRESSINGS)
BNDG CMPR 5X4 KNIT ELC UNQ LF (GAUZE/BANDAGES/DRESSINGS) ×1
BNDG CMPR 6 X 5 YARDS HK CLSR (GAUZE/BANDAGES/DRESSINGS)
BNDG CMPR 75X21 PLY HI ABS (MISCELLANEOUS)
BNDG CMPR 9X6 STRL LF SNTH (GAUZE/BANDAGES/DRESSINGS)
BNDG COHESIVE 4X5 WHT NS (GAUZE/BANDAGES/DRESSINGS) IMPLANT
BNDG ELASTIC 4INX 5YD STR LF (GAUZE/BANDAGES/DRESSINGS) ×1 IMPLANT
BNDG ELASTIC 6INX 5YD STR LF (GAUZE/BANDAGES/DRESSINGS) IMPLANT
BNDG ESMARK 6X9 LF (GAUZE/BANDAGES/DRESSINGS)
BNDG GZE 12X3 1 PLY HI ABS (GAUZE/BANDAGES/DRESSINGS) ×1
BNDG STRETCH GAUZE 3IN X12FT (GAUZE/BANDAGES/DRESSINGS) ×1 IMPLANT
BUR MIS CONICAL WEDGE 4.3X13 (BURR) IMPLANT
BUR MIS STRT 2.0X19.5 (BURR) IMPLANT
BURR MIS CONICAL WEDGE 4.3X13 (BURR)
BURR MIS STRT 2.0X19.5 (BURR) ×1
CHLORAPREP W/TINT 26 (MISCELLANEOUS) ×1 IMPLANT
COVER BACK TABLE 60X90IN (DRAPES) ×1 IMPLANT
CUFF TOURN SGL QUICK 24 (TOURNIQUET CUFF)
CUFF TOURN SGL QUICK 34 (TOURNIQUET CUFF)
CUFF TRNQT CYL 24X4X16.5-23 (TOURNIQUET CUFF) IMPLANT
CUFF TRNQT CYL 34X4.125X (TOURNIQUET CUFF) IMPLANT
DRAPE EXTREMITY T 121X128X90 (DISPOSABLE) ×1 IMPLANT
DRAPE OEC MINIVIEW 54X84 (DRAPES) ×1 IMPLANT
DRAPE U-SHAPE 47X51 STRL (DRAPES) ×1 IMPLANT
DRESSING MEPILEX FLEX 4X4 (GAUZE/BANDAGES/DRESSINGS) IMPLANT
DRILL COUNTERSINK CANN 3 (BIT) IMPLANT
DRSG MEPILEX FLEX 4X4 (GAUZE/BANDAGES/DRESSINGS) ×1
DRSG MEPITEL 4X7.2 (GAUZE/BANDAGES/DRESSINGS) ×1 IMPLANT
ELECT REM PT RETURN 9FT ADLT (ELECTROSURGICAL)
ELECTRODE REM PT RTRN 9FT ADLT (ELECTROSURGICAL) ×1 IMPLANT
GAUZE PAD ABD 8X10 STRL (GAUZE/BANDAGES/DRESSINGS) ×1 IMPLANT
GAUZE SPONGE 4X4 12PLY STRL (GAUZE/BANDAGES/DRESSINGS) ×1 IMPLANT
GAUZE STRETCH 2X75IN STRL (MISCELLANEOUS) IMPLANT
GLOVE BIO SURGEON STRL SZ8 (GLOVE) ×1 IMPLANT
GLOVE BIOGEL PI IND STRL 7.0 (GLOVE) IMPLANT
GLOVE BIOGEL PI IND STRL 7.5 (GLOVE) IMPLANT
GLOVE BIOGEL PI IND STRL 8 (GLOVE) ×2 IMPLANT
GLOVE ECLIPSE 8.0 STRL XLNG CF (GLOVE) ×1 IMPLANT
GLOVE SURG SS PI 7.0 STRL IVOR (GLOVE) IMPLANT
GOWN STRL REUS W/ TWL LRG LVL3 (GOWN DISPOSABLE) ×1 IMPLANT
GOWN STRL REUS W/ TWL XL LVL3 (GOWN DISPOSABLE) ×2 IMPLANT
GOWN STRL REUS W/TWL LRG LVL3 (GOWN DISPOSABLE) ×1
GOWN STRL REUS W/TWL XL LVL3 (GOWN DISPOSABLE) ×2
GUIDEWIRE 0.86MM (WIRE) IMPLANT
GUIDEWIRE 1.6X150 (WIRE) IMPLANT
GUIDEWIRE BEVELED FT 1.4X3.5 (WIRE) IMPLANT
K-WIRE DBL .054X9 NSTRL (WIRE) ×1
KWIRE DBL .054X9 NSTRL (WIRE) ×1 IMPLANT
NDL HYPO 22X1.5 SAFETY MO (MISCELLANEOUS) IMPLANT
NDL HYPO 25X1 1.5 SAFETY (NEEDLE) IMPLANT
NEEDLE HYPO 22X1.5 SAFETY MO (MISCELLANEOUS) IMPLANT
NEEDLE HYPO 25X1 1.5 SAFETY (NEEDLE) IMPLANT
NS IRRIG 1000ML POUR BTL (IV SOLUTION) ×1 IMPLANT
PACK BASIN DAY SURGERY FS (CUSTOM PROCEDURE TRAY) ×1 IMPLANT
PAD CAST 4YDX4 CTTN HI CHSV (CAST SUPPLIES) ×1 IMPLANT
PADDING CAST ABS COTTON 4X4 ST (CAST SUPPLIES) IMPLANT
PADDING CAST COTTON 4X4 STRL (CAST SUPPLIES) ×1
PADDING CAST COTTON 6X4 STRL (CAST SUPPLIES) IMPLANT
PENCIL SMOKE EVACUATOR (MISCELLANEOUS) ×1 IMPLANT
SANITIZER HAND PURELL FF 515ML (MISCELLANEOUS) ×1 IMPLANT
SCREW BEVELED 3.5X54 (Screw) IMPLANT
SCREW BEVELED FT 3.5X32 (Screw) IMPLANT
SCREW CANN FT BEVELED 3.5X50 (Screw) IMPLANT
SCREW COMPRESSION 2.5X24 (Screw) IMPLANT
SCREW COMPRESSION 2.5X25 (Screw) ×1 IMPLANT
SET IRRIGATION TUBING (TUBING) IMPLANT
SHEET MEDIUM DRAPE 40X70 STRL (DRAPES) ×1 IMPLANT
SLEEVE SCD COMPRESS KNEE MED (STOCKING) ×1 IMPLANT
SPLINT PLASTER CAST FAST 5X30 (CAST SUPPLIES) IMPLANT
SPONGE T-LAP 18X18 ~~LOC~~+RFID (SPONGE) ×1 IMPLANT
STOCKINETTE 6 STRL (DRAPES) ×1 IMPLANT
SUCTION TUBE FRAZIER 10FR DISP (SUCTIONS) ×1 IMPLANT
SUT ETHILON 3 0 PS 1 (SUTURE) ×1 IMPLANT
SUT MNCRL AB 3-0 PS2 18 (SUTURE) ×1 IMPLANT
SUT VIC AB 2-0 SH 27 (SUTURE)
SUT VIC AB 2-0 SH 27XBRD (SUTURE) IMPLANT
SUT VICRYL 0 SH 27 (SUTURE) IMPLANT
SUT VICRYL 0 UR6 27IN ABS (SUTURE) IMPLANT
SYR BULB EAR ULCER 3OZ GRN STR (SYRINGE) ×1 IMPLANT
SYR CONTROL 10ML LL (SYRINGE) IMPLANT
TOWEL GREEN STERILE FF (TOWEL DISPOSABLE) ×2 IMPLANT
TUBE CONNECTING 20X1/4 (TUBING) IMPLANT
UNDERPAD 30X36 HEAVY ABSORB (UNDERPADS AND DIAPERS) ×1 IMPLANT
YANKAUER SUCT BULB TIP NO VENT (SUCTIONS) IMPLANT

## 2023-01-20 NOTE — Anesthesia Procedure Notes (Addendum)
Anesthesia Regional Block: Popliteal block   Pre-Anesthetic Checklist: , timeout performed,  Correct Patient, Correct Site, Correct Laterality,  Correct Procedure, Correct Position, site marked,  Risks and benefits discussed,  Surgical consent,  Pre-op evaluation,  At surgeon's request and post-op pain management  Laterality: Left  Prep: chloraprep       Needles:  Injection technique: Single-shot  Needle Type: Echogenic Stimulator Needle     Needle Length: 5cm  Needle Gauge: 22     Additional Needles:   Procedures:, nerve stimulator,,, ultrasound used (permanent image in chart),,     Nerve Stimulator or Paresthesia:  Response: foot, 0.45 mA  Additional Responses:   Narrative:  Start time: 01/20/2023 9:45 AM End time: 01/20/2023 9:51 AM Injection made incrementally with aspirations every 5 mL.  Performed by: Personally  Anesthesiologist: Bethena Midget, MD  Additional Notes: Functioning IV was confirmed and monitors were applied.  A 50mm 22ga Arrow echogenic stimulator needle was used. Sterile prep and drape,hand hygiene and sterile gloves were used. Ultrasound guidance: relevant anatomy identified, needle position confirmed, local anesthetic spread visualized around nerve(s)., vascular puncture avoided.  Image printed for medical record. Negative aspiration and negative test dose prior to incremental administration of local anesthetic. The patient tolerated the procedure well.

## 2023-01-20 NOTE — Anesthesia Postprocedure Evaluation (Signed)
Anesthesia Post Note  Patient: Jaymarie Yeakel  Procedure(s) Performed: left minimally invasive chevron and akin osteotomies (Left: Toe)     Patient location during evaluation: PACU Anesthesia Type: Regional and General Level of consciousness: awake and alert Pain management: pain level controlled Vital Signs Assessment: post-procedure vital signs reviewed and stable Respiratory status: spontaneous breathing, nonlabored ventilation, respiratory function stable and patient connected to nasal cannula oxygen Cardiovascular status: blood pressure returned to baseline and stable Postop Assessment: no apparent nausea or vomiting Anesthetic complications: no   No notable events documented.  Last Vitals:  Vitals:   01/20/23 1101 01/20/23 1110  BP:  (!) 113/58  Pulse: 71 72  Resp: 11 18  Temp:  (!) 36.1 C  SpO2: 100% 97%    Last Pain:  Vitals:   01/20/23 1110  TempSrc:   PainSc: 0-No pain                 Amorette Charrette

## 2023-01-20 NOTE — Progress Notes (Signed)
Assisted Dr. Oddono with left, adductor canal, popliteal, ultrasound guided block. Side rails up, monitors on throughout procedure. See vital signs in flow sheet. Tolerated Procedure well. 

## 2023-01-20 NOTE — Discharge Instructions (Addendum)
Toni Arthurs, MD [facility]  Please read the following information regarding your care after surgery.  Medications  You only need a prescription for the narcotic pain medicine (ex. oxycodone, Percocet, Norco).  All of the other medicines listed below are available over the counter. ? Aleve 2 pills twice a day for the first 3 days after surgery. ? acetominophen (Tylenol) 650 mg every 4-6 hours as you need for minor to moderate pain ? oxycodone as prescribed for severe pain  Narcotic pain medicine (ex. oxycodone, Percocet, Vicodin) will cause constipation.  To prevent this problem, take the following medicines while you are taking any pain medicine. ? docusate sodium (Colace) 100 mg twice a day ? senna (Senokot) 2 tablets twice a day  Weight Bearing ? Bear weight only on your operated foot in the post-op shoe.   Cast / Splint / Dressing ? Keep your splint, cast or dressing clean and dry.  Don't put anything (coat hanger, pencil, etc) down inside of it.  If it gets damp, use a hair dryer on the cool setting to dry it.  If it gets soaked, call the office to schedule an appointment for a cast change.   After your dressing, cast or splint is removed; you may shower, but do not soak or scrub the wound.  Allow the water to run over it, and then gently pat it dry.  Swelling It is normal for you to have swelling where you had surgery.  To reduce swelling and pain, keep your toes above your nose for at least 3 days after surgery.  It may be necessary to keep your foot or leg elevated for several weeks.  If it hurts, it should be elevated.  Follow Up Call my office at [phone] when you are discharged from the hospital or surgery center to schedule an appointment to be seen two weeks after surgery.  Call my office at [phone] if you develop a fever >101.5 F, nausea, vomiting, bleeding from the surgical site or severe pain.    Post Anesthesia Home Care Instructions  Activity: Get  plenty of rest for the remainder of the day. A responsible individual must stay with you for 24 hours following the procedure.  For the next 24 hours, DO NOT: -Drive a car -Advertising copywriter -Drink alcoholic beverages -Take any medication unless instructed by your physician -Make any legal decisions or sign important papers.  Meals: Start with liquid foods such as gelatin or soup. Progress to regular foods as tolerated. Avoid greasy, spicy, heavy foods. If nausea and/or vomiting occur, drink only clear liquids until the nausea and/or vomiting subsides. Call your physician if vomiting continues.  Special Instructions/Symptoms: Your throat may feel dry or sore from the anesthesia or the breathing tube placed in your throat during surgery. If this causes discomfort, gargle with warm salt water. The discomfort should disappear within 24 hours.  If you had a scopolamine patch placed behind your ear for the management of post- operative nausea and/or vomiting:  1. The medication in the patch is effective for 72 hours, after which it should be removed.  Wrap patch in a tissue and discard in the trash. Wash hands thoroughly with soap and water. 2. You may remove the patch earlier than 72 hours if you experience unpleasant side effects which may include dry mouth, dizziness or visual disturbances. 3. Avoid touching the patch. Wash your hands with soap and water after contact with the patch.    Regional Anesthesia Blocks  1. You  may not be able to move or feel the "blocked" extremity after a regional anesthetic block. This may last may last from 3-48 hours after placement, but it will go away. The length of time depends on the medication injected and your individual response to the medication. As the nerves start to wake up, you may experience tingling as the movement and feeling returns to your extremity. If the numbness and inability to move your extremity has not gone away after 48 hours, please call  your surgeon.   2. The extremity that is blocked will need to be protected until the numbness is gone and the strength has returned. Because you cannot feel it, you will need to take extra care to avoid injury. Because it may be weak, you may have difficulty moving it or using it. You may not know what position it is in without looking at it while the block is in effect.  3. For blocks in the legs and feet, returning to weight bearing and walking needs to be done carefully. You will need to wait until the numbness is entirely gone and the strength has returned. You should be able to move your leg and foot normally before you try and bear weight or walk. You will need someone to be with you when you first try to ensure you do not fall and possibly risk injury.  4. Bruising and tenderness at the needle site are common side effects and will resolve in a few days.  5. Persistent numbness or new problems with movement should be communicated to the surgeon or the [facility] [phone] [facility] [phone]). Information for Discharge Teaching: EXPAREL (bupivacaine liposome injectable suspension)   Pain relief is important to your recovery. The goal is to control your pain so you can move easier and return to your normal activities as soon as possible after your procedure. Your physician may use several types of medicines to manage pain, swelling, and more.  Your surgeon or anesthesiologist gave you EXPAREL(bupivacaine) to help control your pain after surgery.  EXPAREL is a local anesthetic designed to release slowly over an extended period of time to provide pain relief by numbing the tissue around the surgical site. EXPAREL is designed to release pain medication over time and can control pain for up to 72 hours. Depending on how you respond to EXPAREL, you may require less pain medication during your recovery. EXPAREL can help reduce or eliminate the need for opioids  during the first few days after surgery when pain relief is needed the most. EXPAREL is not an opioid and is not addictive. It does not cause sleepiness or sedation.   Important! A teal colored band has been placed on your arm with the date, time and amount of EXPAREL you have received. Please leave this armband in place for the full 96 hours following administration, and then you may remove the band. If you return to the hospital for any reason within 96 hours following the administration of EXPAREL, the armband provides important information that your health care providers to know, and alerts them that you have received this anesthetic.    Possible side effects of EXPAREL: Temporary loss of sensation or ability to move in the area where medication was injected. Nausea, vomiting, constipation Rarely, numbness and tingling in your mouth or lips, lightheadedness, or anxiety may occur. Call your doctor right away if you think you may be experiencing any of these sensations, or if you have  other questions regarding possible side effects.  Follow all other discharge instructions given to you by your surgeon or nurse. Eat a healthy diet and drink plenty of water or other fluids.  Tylenol after 2pm

## 2023-01-20 NOTE — Anesthesia Procedure Notes (Signed)
Procedure Name: LMA Insertion Date/Time: 01/20/2023 9:09 AM  Performed by: Burna Cash, CRNAPre-anesthesia Checklist: Patient identified, Emergency Drugs available, Suction available and Patient being monitored Patient Re-evaluated:Patient Re-evaluated prior to induction Oxygen Delivery Method: Circle system utilized Preoxygenation: Pre-oxygenation with 100% oxygen Induction Type: IV induction Ventilation: Mask ventilation without difficulty LMA: LMA inserted LMA Size: 4.0 Number of attempts: 1 Airway Equipment and Method: Bite block Placement Confirmation: positive ETCO2 Tube secured with: Tape Dental Injury: Teeth and Oropharynx as per pre-operative assessment

## 2023-01-20 NOTE — H&P (Signed)
Emily Sandoval is an 47 y.o. female.   Chief Complaint: left foot pain HPI: 47 y/o female without significant PMH c/o left foot pain due to a prominent bunion deformity.  She has failed non op treatment and presents today for surgical correction.  Past Medical History:  Diagnosis Date   Hyperlipidemia     Past Surgical History:  Procedure Laterality Date   APPENDECTOMY     CESAREAN SECTION     2005   CESAREAN SECTION     2008   COLONOSCOPY      Family History  Problem Relation Age of Onset   Osteoporosis Mother    High Cholesterol Mother    High blood pressure Father    Stroke Father    High Cholesterol Father    Healthy Sister    Bipolar disorder Brother    Breast cancer Maternal Grandmother 17   Dementia Paternal Grandmother    Stroke Paternal Grandfather    Social History:  reports that she quit smoking about 21 years ago. Her smoking use included cigarettes. She started smoking about 31 years ago. She has a 2.5 pack-year smoking history. She has never used smokeless tobacco. She reports current alcohol use of about 5.0 standard drinks of alcohol per week. No history on file for drug use.  Allergies:  Allergies  Allergen Reactions   Morphine Nausea Only    Other Reaction(s): vomiting   Hydrocodone Nausea And Vomiting   Sulfamethoxazole-Trimethoprim Other (See Comments) and Nausea And Vomiting   Tetanus Antitoxin Other (See Comments)   Tetanus Immune Globulin Nausea And Vomiting    Medications Prior to Admission  Medication Sig Dispense Refill   cetirizine (ZYRTEC) 10 MG tablet Take 1 tablet by mouth daily.     cholecalciferol (VITAMIN D3) 25 MCG (1000 UNIT) tablet Take 1,000 Units by mouth daily.     cyanocobalamin (VITAMIN B12) 500 MCG tablet Take 500 mcg by mouth daily.     rosuvastatin (CRESTOR) 10 MG tablet Take 10 mg by mouth at bedtime.     albuterol (ACCUNEB) 0.63 MG/3ML nebulizer solution Take 3 mLs (0.63 mg total) by nebulization every 6 (six) hours as  needed for wheezing. 75 mL 0   albuterol (VENTOLIN HFA) 108 (90 Base) MCG/ACT inhaler SMARTSIG:1 Puff(s) Via Inhaler Every 4 Hours PRN     b complex vitamins capsule Take 1 capsule by mouth daily.     fluticasone-salmeterol (ADVAIR DISKUS) 250-50 MCG/ACT AEPB Inhale 1 puff into the lungs in the morning and at bedtime. 60 each 5   levonorgestrel (MIRENA, 52 MG,) 20 MCG/DAY IUD insert one intrauterine device via intrauterine route, provided by [facility]     ondansetron (ZOFRAN) 4 MG tablet Take 1 tablet (4 mg total) by mouth every 8 (eight) hours as needed. 30 tablet 0   Semaglutide-Weight Management (WEGOVY) 0.25 MG/0.5ML SOAJ Inject 0.25 mg into the skin once a week. 2 mL 0   tirzepatide (ZEPBOUND) 5 MG/0.5ML Pen Inject 5 mg into the skin every 7 (seven) days. 2 mL 0   tirzepatide (ZEPBOUND) 7.5 MG/0.5ML Pen Inject 7.5 mg into the skin every 7 (seven) days. 2 mL 0   topiramate (TOPAMAX) 25 MG tablet Take 50 mg by mouth daily.      No results found for this or any previous visit (from the past 48 hour(s)). No results found.  Review of Systems  no recent f/c/n/v/wt loss  Height 5\' 1"  (1.549 m), weight 61.2 kg, last menstrual period 11/23/2022. Physical Exam  Wn wd woman in nad.  A and O.  EOMI.  Resp unlabored.  L foot with healthy skin and palpable pulses.  No lymphadenopathy.  5/5 strength in PF and DF of the ankle and toes.  Intact sens to LT in the dorsal and plantar forefoot.  Prominent bunion deformity at the first ray.    Assessment/Plan Painful left foot bunion deformity.  To the OR today for surgical correction with MIS double osteotomy.  The risks and benefits of the alternative treatment options have been discussed in detail.  The patient wishes to proceed with surgery and specifically understands risks of bleeding, infection, nerve damage, blood clots, need for additional surgery, amputation and death.   Toni Arthurs, MD 09-Feb-2023, 7:19 AM

## 2023-01-20 NOTE — Anesthesia Procedure Notes (Addendum)
Anesthesia Regional Block: Adductor canal block   Pre-Anesthetic Checklist: , timeout performed,  Correct Patient, Correct Site, Correct Laterality,  Correct Procedure, Correct Position, site marked,  Risks and benefits discussed,  Surgical consent,  Pre-op evaluation,  At surgeon's request and post-op pain management  Laterality: Left  Prep: chloraprep       Needles:  Injection technique: Single-shot  Needle Type: Echogenic Stimulator Needle     Needle Length: 5cm  Needle Gauge: 22     Additional Needles:   Procedures:,,,, ultrasound used (permanent image in chart),,    Narrative:  Start time: 01/20/2023 9:55 AM End time: 01/20/2023 9:00 AM Injection made incrementally with aspirations every 5 mL.  Performed by: Personally  Anesthesiologist: Heather Roberts, MD  Additional Notes: Functioning IV was confirmed and monitors were applied.  A 50mm 22ga Arrow echogenic stimulator needle was used. Sterile prep and drape,hand hygiene and sterile gloves were used. Ultrasound guidance: relevant anatomy identified, needle position confirmed, local anesthetic spread visualized around nerve(s)., vascular puncture avoided.  Image printed for medical record. Negative aspiration and negative test dose prior to incremental administration of local anesthetic. The patient tolerated the procedure well.

## 2023-01-20 NOTE — Op Note (Signed)
01/20/2023  10:22 AM  PATIENT:  Emily Sandoval  47 y.o. female  PRE-OPERATIVE DIAGNOSIS: Painful left foot bunion deformity  POST-OPERATIVE DIAGNOSIS: Same  Procedure(s):  1.  Correction of painful left foot bunion deformity with first metatarsal and Akin osteotomies 2.  AP and lateral radiographs of the left foot  SURGEON:  Toni Arthurs, MD  ASSISTANT: Alfredo Martinez, PA-C  ANESTHESIA:   General, regional  EBL: 10 cc  TOURNIQUET: None  COMPLICATIONS:  None apparent  DISPOSITION:  Extubated, awake and stable to recovery.  INDICATION FOR PROCEDURE: 47 year old female without significant past medical history complains of increasing pain at her left foot bunion deformity.  She has failed nonoperative treatment including activity modification, oral anti-inflammatories and shoewear modification.  She presents now for left foot bunion correction.  The risks and benefits of the alternative treatment options have been discussed in detail.  The patient wishes to proceed with surgery and specifically understands risks of bleeding, infection, nerve damage, blood clots, need for additional surgery, amputation and death.    PROCEDURE IN DETAIL:  After pre operative consent was obtained, and the correct operative site was identified, the patient was brought to the operating room and placed supine on the OR table.  Anesthesia was administered.  Pre-operative antibiotics were administered.  A surgical timeout was taken.  The left lower extremity was prepped and draped in standard sterile fashion.  A guidepin for a 3.5 mm Arthrex beveled compression screw was selected.  It was inserted from the dorsal medial base of the first metatarsal and advanced across to the lateral cortex.  Radiographs confirmed appropriate position of the guidepin.  The parallel pin guide was then used to insert a second pin distal and medial from the first parallel to the first wire.  Radiographs confirmed appropriate position of  both guidepins.  Both were pulled back slightly.  A percutaneous osteotomy was made at the first metatarsal neck with a 2 x 20 mm Shannon bur.  The head of the metatarsal was then mobilized and translated laterally correcting the intermetatarsal angle.  The guide pins were advanced into the head under fluoroscopic guidance.  AP and lateral radiographs confirmed appropriate position of both guidepins.  The more lateral guidepin was measured and overdrilled.  The fully threaded 3.5 millimeter screw was inserted and seated appropriately at the medial cortex.  The second screw was placed over the more medial wire in the same fashion.  Radiographs confirmed appropriate position and length of both screws.  The guidepins were removed.  Attention was turned to the hallux.  A medial closing wedge osteotomy was made percutaneously with the 2 x 20 mm Shannon bur.  The osteotomy was closed and fixed with a 2.5 mm Arthrex headless compression screw.  The overhanging bone at the first metatarsal was smoothed with a small rondure and rasp.  Final AP and lateral radiographs confirmed appropriate correction of the bunion deformity with appropriate position and length of all hardware.  The wounds were irrigated copiously and closed with simple and horizontal mattress sutures of 3-0 nylon.  Sterile dressings were applied followed by a bunion wrap.  The patient was awakened from anesthesia and transported to the recovery room in stable condition.   FOLLOW UP PLAN: Weightbearing as tolerated in a Darco shoe.  Follow-up in the office in 2 weeks for suture removal, toe spacer and a flat postop shoe.  No indication for DVT prophylaxis in this ambulatory patient.   RADIOGRAPHS: AP and lateral radiographs of the  left foot are obtained intraoperatively.  These show interval correction of the bunion deformity with first metatarsal and hallux proximal phalanx osteotomies.  Hardware is appropriately positioned and of the appropriate  lengths.  No other acute injuries are noted.    Alfredo Martinez PA-C was present and scrubbed for the duration of the operative case. His assistance was essential in positioning the patient, prepping and draping, gaining and maintaining exposure, performing the operation, closing and dressing the wounds and applying the splint.

## 2023-01-20 NOTE — Transfer of Care (Signed)
Immediate Anesthesia Transfer of Care Note  Patient: Emily Sandoval  Procedure(s) Performed: left minimally invasive chevron and akin osteotomies (Left: Toe)  Patient Location: PACU  Anesthesia Type:GA combined with regional for post-op pain  Level of Consciousness: awake, alert , and oriented  Airway & Oxygen Therapy: Patient Spontanous Breathing and Patient connected to face mask oxygen  Post-op Assessment: Report given to RN and Post -op Vital signs reviewed and stable  Post vital signs: Reviewed and stable  Last Vitals:  Vitals Value Taken Time  BP 120/63 01/20/23 1020  Temp    Pulse 76 01/20/23 1024  Resp 8 01/20/23 1024  SpO2 100 % 01/20/23 1024  Vitals shown include unfiled device data.  Last Pain:  Vitals:   01/20/23 0748  TempSrc: Temporal  PainSc: 0-No pain         Complications: No notable events documented.

## 2023-01-21 ENCOUNTER — Encounter (HOSPITAL_BASED_OUTPATIENT_CLINIC_OR_DEPARTMENT_OTHER): Payer: Self-pay | Admitting: Orthopedic Surgery

## 2023-02-14 ENCOUNTER — Other Ambulatory Visit (HOSPITAL_BASED_OUTPATIENT_CLINIC_OR_DEPARTMENT_OTHER): Payer: Self-pay

## 2023-02-14 MED ORDER — ZEPBOUND 7.5 MG/0.5ML ~~LOC~~ SOAJ
7.5000 mg | SUBCUTANEOUS | 1 refills | Status: AC
  Filled 2023-02-14: qty 2, 28d supply, fill #0
  Filled 2023-07-11: qty 2, 28d supply, fill #1

## 2023-02-18 ENCOUNTER — Ambulatory Visit
Admission: RE | Admit: 2023-02-18 | Discharge: 2023-02-18 | Disposition: A | Discharge status: Home / Self Care | Payer: Commercial Managed Care - PPO | Source: Ambulatory Visit | Attending: Family Medicine | Admitting: Family Medicine

## 2023-02-18 DIAGNOSIS — Z1231 Encounter for screening mammogram for malignant neoplasm of breast: Secondary | ICD-10-CM

## 2023-02-22 ENCOUNTER — Other Ambulatory Visit (HOSPITAL_COMMUNITY): Payer: Self-pay

## 2023-03-21 ENCOUNTER — Other Ambulatory Visit (HOSPITAL_BASED_OUTPATIENT_CLINIC_OR_DEPARTMENT_OTHER): Payer: Self-pay

## 2023-03-21 MED ORDER — ZEPBOUND 10 MG/0.5ML ~~LOC~~ SOAJ
10.0000 mg | SUBCUTANEOUS | 1 refills | Status: AC
  Filled 2023-03-21: qty 2, 28d supply, fill #0
  Filled 2023-05-16: qty 2, 28d supply, fill #1

## 2023-04-11 ENCOUNTER — Other Ambulatory Visit (HOSPITAL_BASED_OUTPATIENT_CLINIC_OR_DEPARTMENT_OTHER): Payer: Self-pay

## 2023-04-11 MED ORDER — ESZOPICLONE 1 MG PO TABS
1.0000 mg | ORAL_TABLET | Freq: Every day | ORAL | 1 refills | Status: AC
  Filled 2023-04-11: qty 15, 25d supply, fill #0
  Filled 2023-05-16: qty 15, 25d supply, fill #1
  Filled 2023-07-11: qty 15, 25d supply, fill #2
  Filled 2023-08-08: qty 15, 25d supply, fill #3

## 2023-04-12 ENCOUNTER — Other Ambulatory Visit: Payer: Self-pay

## 2023-04-18 ENCOUNTER — Other Ambulatory Visit (HOSPITAL_BASED_OUTPATIENT_CLINIC_OR_DEPARTMENT_OTHER): Payer: Self-pay

## 2023-04-18 MED ORDER — ZEPBOUND 10 MG/0.5ML ~~LOC~~ SOAJ
10.0000 mg | SUBCUTANEOUS | 1 refills | Status: AC
  Filled 2023-04-18: qty 2, 28d supply, fill #0

## 2023-05-16 ENCOUNTER — Other Ambulatory Visit: Payer: Self-pay

## 2023-05-16 ENCOUNTER — Other Ambulatory Visit (HOSPITAL_BASED_OUTPATIENT_CLINIC_OR_DEPARTMENT_OTHER): Payer: Self-pay

## 2023-05-17 ENCOUNTER — Other Ambulatory Visit (HOSPITAL_BASED_OUTPATIENT_CLINIC_OR_DEPARTMENT_OTHER): Payer: Self-pay

## 2023-05-30 ENCOUNTER — Other Ambulatory Visit (HOSPITAL_BASED_OUTPATIENT_CLINIC_OR_DEPARTMENT_OTHER): Payer: Self-pay

## 2023-05-30 MED ORDER — ZEPBOUND 10 MG/0.5ML ~~LOC~~ SOAJ: 10.0000 mg | SUBCUTANEOUS | 1 refills | Status: AC

## 2023-05-31 ENCOUNTER — Other Ambulatory Visit (HOSPITAL_BASED_OUTPATIENT_CLINIC_OR_DEPARTMENT_OTHER): Payer: Self-pay

## 2023-06-14 ENCOUNTER — Other Ambulatory Visit (HOSPITAL_BASED_OUTPATIENT_CLINIC_OR_DEPARTMENT_OTHER): Payer: Self-pay

## 2023-06-14 MED ORDER — ZEPBOUND 7.5 MG/0.5ML ~~LOC~~ SOAJ
7.5000 mg | SUBCUTANEOUS | 1 refills | Status: AC
  Filled 2023-06-14: qty 2, 28d supply, fill #0
  Filled 2023-08-08: qty 2, 28d supply, fill #1

## 2023-07-11 ENCOUNTER — Other Ambulatory Visit (HOSPITAL_BASED_OUTPATIENT_CLINIC_OR_DEPARTMENT_OTHER): Payer: Self-pay

## 2023-07-11 ENCOUNTER — Other Ambulatory Visit: Payer: Self-pay

## 2023-07-12 ENCOUNTER — Other Ambulatory Visit (HOSPITAL_BASED_OUTPATIENT_CLINIC_OR_DEPARTMENT_OTHER): Payer: Self-pay

## 2023-07-12 MED ORDER — ZEPBOUND 7.5 MG/0.5ML ~~LOC~~ SOAJ
7.5000 mg | SUBCUTANEOUS | 1 refills | Status: AC
  Filled 2023-07-12 – 2023-07-18 (×2): qty 2, 28d supply, fill #0

## 2023-07-18 ENCOUNTER — Other Ambulatory Visit (HOSPITAL_BASED_OUTPATIENT_CLINIC_OR_DEPARTMENT_OTHER): Payer: Self-pay

## 2023-07-20 ENCOUNTER — Other Ambulatory Visit (HOSPITAL_BASED_OUTPATIENT_CLINIC_OR_DEPARTMENT_OTHER): Payer: Self-pay

## 2023-07-20 MED ORDER — ROSUVASTATIN CALCIUM 10 MG PO TABS
10.0000 mg | ORAL_TABLET | ORAL | 3 refills | Status: AC
  Filled 2023-07-20: qty 12, 28d supply, fill #0
  Filled 2023-09-25: qty 12, 28d supply, fill #1
  Filled 2023-10-31: qty 12, 28d supply, fill #2
  Filled 2024-02-07: qty 12, 28d supply, fill #3
  Filled 2024-03-13: qty 12, 28d supply, fill #4

## 2023-08-09 ENCOUNTER — Other Ambulatory Visit: Payer: Self-pay

## 2023-08-16 ENCOUNTER — Other Ambulatory Visit (HOSPITAL_BASED_OUTPATIENT_CLINIC_OR_DEPARTMENT_OTHER): Payer: Self-pay

## 2023-08-16 MED ORDER — ZEPBOUND 7.5 MG/0.5ML ~~LOC~~ SOAJ
7.5000 mg | SUBCUTANEOUS | 0 refills | Status: AC
  Filled 2023-08-16 – 2023-09-04 (×2): qty 2, 28d supply, fill #0

## 2023-09-05 ENCOUNTER — Other Ambulatory Visit (HOSPITAL_BASED_OUTPATIENT_CLINIC_OR_DEPARTMENT_OTHER): Payer: Self-pay

## 2023-12-07 ENCOUNTER — Other Ambulatory Visit (HOSPITAL_BASED_OUTPATIENT_CLINIC_OR_DEPARTMENT_OTHER): Payer: Self-pay

## 2023-12-07 MED ORDER — ROSUVASTATIN CALCIUM 10 MG PO TABS
10.0000 mg | ORAL_TABLET | ORAL | 3 refills | Status: AC
  Filled 2023-12-07: qty 12, 28d supply, fill #0
  Filled 2023-12-30: qty 12, 28d supply, fill #1
  Filled 2024-04-10: qty 12, 28d supply, fill #2

## 2023-12-07 MED ORDER — WEGOVY 1 MG/0.5ML ~~LOC~~ SOAJ
1.0000 mg | SUBCUTANEOUS | 0 refills | Status: DC
  Filled 2023-12-07: qty 2, 28d supply, fill #0

## 2023-12-29 ENCOUNTER — Other Ambulatory Visit (HOSPITAL_BASED_OUTPATIENT_CLINIC_OR_DEPARTMENT_OTHER): Payer: Self-pay

## 2023-12-29 MED ORDER — WEGOVY 1 MG/0.5ML ~~LOC~~ SOAJ
1.0000 mg | SUBCUTANEOUS | 0 refills | Status: AC
  Filled 2023-12-29: qty 2, 28d supply, fill #0

## 2024-01-10 ENCOUNTER — Other Ambulatory Visit: Payer: Self-pay | Admitting: Family Medicine

## 2024-01-10 DIAGNOSIS — Z1231 Encounter for screening mammogram for malignant neoplasm of breast: Secondary | ICD-10-CM

## 2024-01-11 ENCOUNTER — Other Ambulatory Visit (HOSPITAL_BASED_OUTPATIENT_CLINIC_OR_DEPARTMENT_OTHER): Payer: Self-pay

## 2024-01-11 MED ORDER — MOUNJARO 7.5 MG/0.5ML ~~LOC~~ SOAJ
7.5000 mg | SUBCUTANEOUS | 0 refills | Status: DC
  Filled 2024-01-11: qty 2, 28d supply, fill #0

## 2024-01-17 ENCOUNTER — Other Ambulatory Visit (HOSPITAL_BASED_OUTPATIENT_CLINIC_OR_DEPARTMENT_OTHER): Payer: Self-pay

## 2024-01-19 ENCOUNTER — Other Ambulatory Visit (HOSPITAL_BASED_OUTPATIENT_CLINIC_OR_DEPARTMENT_OTHER): Payer: Self-pay

## 2024-01-19 MED ORDER — HYOSCYAMINE SULFATE SL 0.125 MG SL SUBL
SUBLINGUAL_TABLET | SUBLINGUAL | 0 refills | Status: AC
  Filled 2024-01-19: qty 30, 5d supply, fill #0

## 2024-02-14 ENCOUNTER — Other Ambulatory Visit (HOSPITAL_BASED_OUTPATIENT_CLINIC_OR_DEPARTMENT_OTHER): Payer: Self-pay

## 2024-02-14 MED ORDER — MOUNJARO 7.5 MG/0.5ML ~~LOC~~ SOAJ
7.5000 mg | SUBCUTANEOUS | 0 refills | Status: AC
  Filled 2024-02-14: qty 2, 28d supply, fill #0

## 2024-02-20 ENCOUNTER — Other Ambulatory Visit (HOSPITAL_BASED_OUTPATIENT_CLINIC_OR_DEPARTMENT_OTHER): Payer: Self-pay

## 2024-02-20 ENCOUNTER — Ambulatory Visit
Admission: RE | Admit: 2024-02-20 | Discharge: 2024-02-20 | Disposition: A | Source: Ambulatory Visit | Attending: Family Medicine

## 2024-02-20 DIAGNOSIS — Z1231 Encounter for screening mammogram for malignant neoplasm of breast: Secondary | ICD-10-CM

## 2024-02-21 ENCOUNTER — Other Ambulatory Visit (HOSPITAL_BASED_OUTPATIENT_CLINIC_OR_DEPARTMENT_OTHER): Payer: Self-pay

## 2024-03-13 ENCOUNTER — Other Ambulatory Visit (HOSPITAL_BASED_OUTPATIENT_CLINIC_OR_DEPARTMENT_OTHER): Payer: Self-pay

## 2024-03-13 MED ORDER — MOUNJARO 10 MG/0.5ML ~~LOC~~ SOAJ
10.0000 mg | SUBCUTANEOUS | 0 refills | Status: DC
  Filled 2024-03-13: qty 2, 28d supply, fill #0

## 2024-04-10 ENCOUNTER — Other Ambulatory Visit: Payer: Self-pay

## 2024-04-10 ENCOUNTER — Other Ambulatory Visit (HOSPITAL_BASED_OUTPATIENT_CLINIC_OR_DEPARTMENT_OTHER): Payer: Self-pay

## 2024-04-10 MED ORDER — MOUNJARO 10 MG/0.5ML ~~LOC~~ SOAJ
10.0000 mg | SUBCUTANEOUS | 0 refills | Status: AC
  Filled 2024-04-10: qty 2, 28d supply, fill #0
